# Patient Record
Sex: Female | Born: 2000
Health system: Southern US, Community
[De-identification: ages and names within clinical notes are randomized; demographics above are authoritative.]

## PROBLEM LIST (undated history)

## (undated) DIAGNOSIS — I880 Nonspecific mesenteric lymphadenitis: Secondary | ICD-10-CM

## (undated) DIAGNOSIS — IMO0002 Reserved for concepts with insufficient information to code with codable children: Secondary | ICD-10-CM

## (undated) HISTORY — PX: WISDOM TOOTH EXTRACTION: SHX21

## (undated) HISTORY — DX: Reserved for concepts with insufficient information to code with codable children: IMO0002

## (undated) HISTORY — DX: Nonspecific mesenteric lymphadenitis: I88.0

---
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
## 2019-09-30 DIAGNOSIS — H52223 Regular astigmatism, bilateral: Secondary | ICD-10-CM | POA: Diagnosis not present

## 2020-01-12 DIAGNOSIS — Z23 Encounter for immunization: Secondary | ICD-10-CM | POA: Diagnosis not present

## 2020-01-30 ENCOUNTER — Other Ambulatory Visit: Payer: Self-pay

## 2020-01-30 ENCOUNTER — Ambulatory Visit (INDEPENDENT_AMBULATORY_CARE_PROVIDER_SITE_OTHER): Payer: 59 | Admitting: Women's Health

## 2020-01-30 ENCOUNTER — Other Ambulatory Visit (HOSPITAL_COMMUNITY): Payer: Self-pay | Admitting: Women's Health

## 2020-01-30 ENCOUNTER — Encounter: Payer: Self-pay | Admitting: Women's Health

## 2020-01-30 VITALS — BP 124/80 | Ht 65.0 in | Wt 218.0 lb

## 2020-01-30 DIAGNOSIS — Z01419 Encounter for gynecological examination (general) (routine) without abnormal findings: Secondary | ICD-10-CM

## 2020-01-30 DIAGNOSIS — N926 Irregular menstruation, unspecified: Secondary | ICD-10-CM | POA: Insufficient documentation

## 2020-01-30 LAB — PREGNANCY, URINE: Preg Test, Ur: NEGATIVE

## 2020-01-30 MED ORDER — DROSPIRENONE-ETHINYL ESTRADIOL 3-0.02 MG PO TABS: 1.0000 | ORAL_TABLET | Freq: Every day | ORAL | 4 refills | Status: DC

## 2020-01-30 MED ORDER — MEDROXYPROGESTERONE ACETATE 10 MG PO TABS: 10.0000 mg | ORAL_TABLET | Freq: Every day | ORAL | 0 refills | Status: DC

## 2020-01-30 MED FILL — DROSPIR-ETH ESTRA 3/.02 MG: 3-0.02 | 84 days supply | Qty: 84 | Fill #0

## 2020-01-30 MED FILL — MEDROXYPROGESTERONE 10 MG T: 10 | 5 days supply | Qty: 5 | Fill #0

## 2020-01-30 NOTE — Progress Notes (Signed)
Lisa Watkins [birth year] [id number]    History:    Presents for annual exam.  New patient.  Reports cycles starting at age 19 but have been irregular since cycle started.  Cycles can be every 1 to 6 months reports usually has  3 -4 cycles per year.  No change in weight.  Thinks she has had Gardasil.  Denies spotting between cycles.  Virgin.  Denies any urinary symptoms, vaginal discharge or abdominal pain.  States feels hormonal/moody often.  Past medical history, past surgical history, family history and social history were all reviewed and documented in the EPIC chart.  Student [employer], contemplating radiology.  Father heart disease, mother healthy, hysterectomy for atypical glandular cells.  ROS:  A ROS was performed and pertinent positives and negatives are included.  Exam:  Vitals:   01/30/20 1404  BP: 124/80  Weight: 218 lb (98.9 kg)  Height: 5\' 5"  (1.651 m)   Body mass index is 36.28 kg/m.   General appearance:  Normal Thyroid:  Symmetrical, normal in size, without palpable masses or nodularity. Respiratory  Auscultation:  Clear without wheezing or rhonchi Cardiovascular  Auscultation:  Regular rate, without rubs, murmurs or gallops  Edema/varicosities:  Not grossly evident Abdominal  Soft,nontender, without masses, guarding or rebound.  Liver/spleen:  No organomegaly noted  Hernia:  None appreciated  Skin  Inspection:  Grossly normal   Breasts: Examined lying and sitting.     Right: Without masses, retractions, discharge or axillary adenopathy.     Left: Without masses, retractions, discharge or axillary adenopathy. Gentitourinary   Inguinal/mons:  Normal without inguinal adenopathy  External genitalia:  Normal  BUS/Urethra/Skene's glands:  Normal  Vagina:  Normal  Cervix: Not visualized   Uterus: Nontender, normal in size, shape and contour.  Midline and mobile  Adnexa/parametria:     Rt: Without masses or tenderness.   Lt: Without masses or tenderness.  Anus and  perineum: Normal    Assessment/Plan:  19 y.o. S WF virgin for annual exam with complaint of irregular cycles.  Cycles every 1 to 6 months  Plan: We will check CBC, TSH, prolactin.  UPT negative, withdrawal with Provera 10 mg p.o. daily for 5 days.  Options reviewed will try yes, prescription, proper use, slight risk for blood clots and strokes reviewed start up instructions discussed.  Importance of condoms if becomes sexually active.  Reviewed cycles may be light but should have a cycle with birth control pills.  Instructed to call if cycle or mood problems with birth control pills.  Reports think she will be able to remember daily.  SBEs, exercise, calcium rich foods, MVI daily encouraged.  Reviewed importance of increasing exercise and decreasing calories/carbs.  Pap screening guidelines reviewed.  Will check with her mother to make sure she has had Gardasil, and if not will return to office for series.     07-03-1984 [facility], 2:38 PM 01/30/2020

## 2020-01-30 NOTE — Patient Instructions (Addendum)
Take the provera for 5 days should have some bleeding or spotting, start the birth control (cycle control!) pills Check with your mom about Gardasil  Vaccine MVI daily  Health Maintenance, Female Adopting a healthy lifestyle and getting preventive care are important in promoting health and wellness. Ask your health care provider about:  The right schedule for you to have regular tests and exams.  Things you can do on your own to prevent diseases and keep yourself healthy. What should I know about diet, weight, and exercise? Eat a healthy diet   Eat a diet that includes plenty of vegetables, fruits, low-fat dairy products, and lean protein.  Do not eat a lot of foods that are high in solid fats, added sugars, or sodium. Maintain a healthy weight Body mass index (BMI) is used to identify weight problems. It estimates body fat based on height and weight. Your health care provider can help determine your BMI and help you achieve or maintain a healthy weight. Get regular exercise Get regular exercise. This is one of the most important things you can do for your health. Most adults should:  Exercise for at least 150 minutes each week. The exercise should increase your heart rate and make you sweat (moderate-intensity exercise).  Do strengthening exercises at least twice a week. This is in addition to the moderate-intensity exercise.  Spend less time sitting. Even light physical activity can be beneficial. Watch cholesterol and blood lipids Have your blood tested for lipids and cholesterol at 20 years of age, then have this test every 5 years. Have your cholesterol levels checked more often if:  Your lipid or cholesterol levels are high.  You are older than 20 years of age.  You are at high risk for heart disease. What should I know about cancer screening? Depending on your health history and family history, you may need to have cancer screening at various ages. This may include  screening for:  Breast cancer.  Cervical cancer.  Colorectal cancer.  Skin cancer.  Lung cancer. What should I know about heart disease, diabetes, and high blood pressure? Blood pressure and heart disease  High blood pressure causes heart disease and increases the risk of stroke. This is more likely to develop in people who have high blood pressure readings, are of African descent, or are overweight.  Have your blood pressure checked: ? Every 3-5 years if you are 33-55 years of age. ? Every year if you are 79 years old or older. Diabetes Have regular diabetes screenings. This checks your fasting blood sugar level. Have the screening done:  Once every three years after age 46 if you are at a normal weight and have a low risk for diabetes.  More often and at a younger age if you are overweight or have a high risk for diabetes. What should I know about preventing infection? Hepatitis B If you have a higher risk for hepatitis B, you should be screened for this virus. Talk with your health care provider to find out if you are at risk for hepatitis B infection. Hepatitis C Testing is recommended for:  Everyone born from [birth year] through 1966.  Anyone with known risk factors for hepatitis C. Sexually transmitted infections (STIs)  Get screened for STIs, including gonorrhea and chlamydia, if: ? You are sexually active and are younger than 20 years of age. ? You are older than 20 years of age and your health care provider tells you that you are at risk for this  type of infection. ? Your sexual activity has changed since you were last screened, and you are at increased risk for chlamydia or gonorrhea. Ask your health care provider if you are at risk.  Ask your health care provider about whether you are at high risk for HIV. Your health care provider may recommend a prescription medicine to help prevent HIV infection. If you choose to take medicine to prevent HIV, you should first get  tested for HIV. You should then be tested every 3 months for as long as you are taking the medicine. Pregnancy  If you are about to stop having your period (premenopausal) and you may become pregnant, seek counseling before you get pregnant.  Take 400 to 800 micrograms (mcg) of folic acid every day if you become pregnant.  Ask for birth control (contraception) if you want to prevent pregnancy. Osteoporosis and menopause Osteoporosis is a disease in which the bones lose minerals and strength with aging. This can result in bone fractures. If you are 48 years old or older, or if you are at risk for osteoporosis and fractures, ask your health care provider if you should:  Be screened for bone loss.  Take a calcium or vitamin D supplement to lower your risk of fractures.  Be given hormone replacement therapy (HRT) to treat symptoms of menopause. Follow these instructions at home: Lifestyle  Do not use any products that contain nicotine or tobacco, such as cigarettes, e-cigarettes, and chewing tobacco. If you need help quitting, ask your health care provider.  Do not use street drugs.  Do not share needles.  Ask your health care provider for help if you need support or information about quitting drugs. Alcohol use  Do not drink alcohol if: ? Your health care provider tells you not to drink. ? You are pregnant, may be pregnant, or are planning to become pregnant.  If you drink alcohol: ? Limit how much you use to 0-1 drink a day. ? Limit intake if you are breastfeeding.  Be aware of how much alcohol is in your drink. In the [place] one drink equals one 12 oz bottle of beer (355 mL), one 5 oz glass of wine (148 mL), or one 1 oz glass of hard liquor (44 mL). General instructions  Schedule regular health, dental, and eye exams.  Stay current with your vaccines.  Tell your health care provider if: ? You often feel depressed. ? You have ever been abused or do not feel safe at  home. Summary  Adopting a healthy lifestyle and getting preventive care are important in promoting health and wellness.  Follow your health care provider's instructions about healthy diet, exercising, and getting tested or screened for diseases.  Follow your health care provider's instructions on monitoring your cholesterol and blood pressure. This information is not intended to replace advice given to you by your health care provider. Make sure you discuss any questions you have with your health care provider. Document Revised: 09/22/2018 Document Reviewed: 09/22/2018 Elsevier Patient Education  2020 [employer].

## 2020-01-31 LAB — CBC WITH DIFFERENTIAL/PLATELET
Absolute Monocytes: 456 cells/uL (ref 200–950)
Basophils Absolute: 80 cells/uL (ref 0–200)
Basophils Relative: 1.4 %
Eosinophils Absolute: 108 cells/uL (ref 15–500)
Eosinophils Relative: 1.9 %
HCT: 45.4 % — ABNORMAL HIGH (ref 35.0–45.0)
Hemoglobin: 15.2 g/dL (ref 11.7–15.5)
Lymphs Abs: 1391 cells/uL (ref 850–3900)
MCH: 31.6 pg (ref 27.0–33.0)
MCHC: 33.5 g/dL (ref 32.0–36.0)
MCV: 94.4 fL (ref 80.0–100.0)
MPV: 10.8 fL (ref 7.5–12.5)
Monocytes Relative: 8 %
Neutro Abs: 3665 cells/uL (ref 1500–7800)
Neutrophils Relative %: 64.3 %
Platelets: 338 10*3/uL (ref 140–400)
RBC: 4.81 10*6/uL (ref 3.80–5.10)
RDW: 11.9 % (ref 11.0–15.0)
Total Lymphocyte: 24.4 %
WBC: 5.7 10*3/uL (ref 3.8–10.8)

## 2020-01-31 LAB — PROLACTIN: Prolactin: 8.6 ng/mL

## 2020-01-31 LAB — TSH: TSH: 3.57 mIU/L

## 2020-02-01 LAB — URINE CULTURE
MICRO NUMBER:: 10383504
Result:: NO GROWTH
SPECIMEN QUALITY:: ADEQUATE

## 2020-02-01 LAB — URINALYSIS, COMPLETE W/RFL CULTURE
Bilirubin Urine: NEGATIVE
Glucose, UA: NEGATIVE
Hgb urine dipstick: NEGATIVE
Hyaline Cast: NONE SEEN /LPF
Nitrites, Initial: NEGATIVE
Specific Gravity, Urine: 1.022 (ref 1.001–1.03)
pH: 5.5 (ref 5.0–8.0)

## 2020-02-01 LAB — CULTURE INDICATED

## 2020-02-03 DIAGNOSIS — Z23 Encounter for immunization: Secondary | ICD-10-CM | POA: Diagnosis not present

## 2020-02-18 DIAGNOSIS — Z20822 Contact with and (suspected) exposure to covid-19: Secondary | ICD-10-CM | POA: Diagnosis not present

## 2020-02-25 ENCOUNTER — Other Ambulatory Visit: Payer: Self-pay

## 2020-02-25 ENCOUNTER — Ambulatory Visit
Admission: EM | Admit: 2020-02-25 | Discharge: 2020-02-25 | Disposition: A | Discharge status: Home / Self Care | Payer: 59 | Attending: Emergency Medicine | Admitting: Emergency Medicine

## 2020-02-25 DIAGNOSIS — H66001 Acute suppurative otitis media without spontaneous rupture of ear drum, right ear: Secondary | ICD-10-CM

## 2020-02-25 DIAGNOSIS — H9201 Otalgia, right ear: Secondary | ICD-10-CM

## 2020-02-25 MED ORDER — AMOXICILLIN 500 MG PO CAPS: 500.0000 mg | ORAL_CAPSULE | Freq: Two times a day (BID) | ORAL | 0 refills | Status: AC

## 2020-02-25 NOTE — ED Triage Notes (Signed)
Pt presents with right ear pain that began yesterday  

## 2020-02-25 NOTE — Discharge Instructions (Signed)
Rest and drink plenty of fluids Prescribed amoxicillin 500 mg twice daily for 10 days Continue to use OTC ibuprofen and/ or tylenol as needed for pain control Follow up with PCP if symptoms persists Return here or go to the ER if you have any new or worsening symptoms fever, chills, nausea, vomiting, chest pain, shortness of breath, worsening ear pain, redness and swelling behind ear, etc..Marland Kitchen

## 2020-02-25 NOTE — ED Provider Notes (Signed)
[facility]   [id number] 02/25/20 Arrival Time: 1409  CC: EAR PAIN  SUBJECTIVE: History from: patient.  Lisa Watkins is a 19 y.o. female who presents with of RT ear pain x 1 day.  Denies a precipitating event, such as swimming or wearing ear plugs.  Patient states the pain is constant and achy in character.  Patient has tried OTC advil without relief.  Denies aggravating factors.  Reports similar symptoms in the past with ear infection.  Denies fever, chills, fatigue, sinus pain, rhinorrhea, ear discharge, sore throat, SOB, wheezing, chest pain, nausea, changes in bowel or bladder habits.    ROS: As per HPI.  All other pertinent ROS negative.     Past Medical History:  Diagnosis Date  . Acute mesenteric adenitis 7 2011   abd ct and ugi with sbft   . IUGR (intrauterine growth restriction)    39 weeks gest diabetes   Past Surgical History:  Procedure Laterality Date  . WISDOM TOOTH EXTRACTION     No Known Allergies No current facility-administered medications on file prior to encounter.   Current Outpatient Medications on File Prior to Encounter  Medication Sig Dispense Refill  . drospirenone-ethinyl estradiol (YAZ) 3-0.02 MG tablet Take 1 tablet by mouth daily. 3 Package 4  . medroxyPROGESTERone (PROVERA) 10 MG tablet Take 1 tablet (10 mg total) by mouth daily. 5 tablet 0  . trimethoprim-polymyxin b (POLYTRIM) ophthalmic solution Place 1 drop into the right eye every 4 (four) hours. While awake for 1 week. 10 mL 0   Social History   Socioeconomic History  . Marital status: Single    Spouse name: Not on file  . Number of children: Not on file  . Years of education: Not on file  . Highest education level: Not on file  Occupational History  . Not on file  Tobacco Use  . Smoking status: Passive Smoke Exposure - Never Smoker  . Smokeless tobacco: Never Used  Substance and Sexual Activity  . Alcohol use: No  . Drug use: No  . Sexual activity: Never    Comment:  virgin  Other Topics Concern  . Not on file  Social History Narrative   HH of 3   Both parents smoke   Mom is a Engineer, civil (consulting)   Pets 3 dogs   To enter 6th grade at [employer]   Social Determinants of Health   Financial Resource Strain:   . Difficulty of Paying Living Expenses:   Food Insecurity:   . Worried About Programme researcher, broadcasting/film/video in the Last Year:   . Barista in the Last Year:   Transportation Needs:   . Freight forwarder (Medical):   Marland Kitchen Lack of Transportation (Non-Medical):   Physical Activity:   . Days of Exercise per Week:   . Minutes of Exercise per Session:   Stress:   . Feeling of Stress :   Social Connections:   . Frequency of Communication with Friends and Family:   . Frequency of Social Gatherings with Friends and Family:   . Attends Religious Services:   . Active Member of Clubs or Organizations:   . Attends Banker Meetings:   Marland Kitchen Marital Status:   Intimate Partner Violence:   . Fear of Current or Ex-Partner:   . Emotionally Abused:   Marland Kitchen Physically Abused:   . Sexually Abused:    Family History  Problem Relation Age of Onset  . Heart attack Father   .  Hyperlipidemia Father   . Hyperlipidemia Maternal Grandfather   . Hypertension Maternal Grandfather   . Cancer Paternal Grandmother        skin  . Peripheral vascular disease Paternal Grandfather     OBJECTIVE:  Vitals:   02/25/20 1429  BP: 136/87  Pulse: (!) 109  Resp: 18  Temp: 98 F (36.7 C)  TempSrc: Oral  SpO2: 97%     General appearance: alert; mildly fatigued appearing, nontoxic; speaking in full sentences and tolerating own secretions HEENT: NCAT; Ears: EACs clear, LT TM pearly gray, RT TM erythematous with purulent drainage; Eyes: PERRL.  EOM grossly intact.Nose: nares patent without rhinorrhea, Throat: oropharynx clear, tonsils non erythematous or enlarged, uvula midline  Neck: supple without LAD Lungs: unlabored respirations, symmetrical air entry;  cough: absent; no respiratory distress; CTAB Heart: regular rate and rhythm.  Skin: warm and dry Psychological: alert and cooperative; normal mood and affect  ASSESSMENT & PLAN:  1. Right ear pain   2. Non-recurrent acute suppurative otitis media of right ear without spontaneous rupture of tympanic membrane     Meds ordered this encounter  Medications  . amoxicillin (AMOXIL) 500 MG capsule    Sig: Take 1 capsule (500 mg total) by mouth 2 (two) times daily for 10 days.    Dispense:  20 capsule    Refill:  0    Order Specific Question:   Supervising Provider    Answer:   Raylene Everts [[id number]]   Rest and drink plenty of fluids Prescribed amoxicillin 500 mg twice daily for 10 days Continue to use OTC ibuprofen and/ or tylenol as needed for pain control Follow up with PCP if symptoms persists Return here or go to the ER if you have any new or worsening symptoms fever, chills, nausea, vomiting, chest pain, shortness of breath, worsening ear pain, redness and swelling behind ear, etc...  Reviewed expectations re: course of current medical issues. Questions answered. Outlined signs and symptoms indicating need for more acute intervention. Patient verbalized understanding. After Visit Summary given.         Lestine Box, PA-C 02/25/20 1515

## 2020-04-11 MED FILL — DROSPIR-ETH ESTRA 3/.02 MG: 3-0.02 | 84 days supply | Qty: 84 | Fill #1

## 2020-06-26 MED FILL — DROSPIRENONE-ETHINYL ESTRAD: 3-0.02 | 84 days supply | Qty: 84 | Fill #2

## 2020-10-02 MED FILL — DROSPIRENONE-ETHINYL ESTRAD: 3-0.02 | 84 days supply | Qty: 84 | Fill #3

## 2020-12-13 MED FILL — DROSPIRENONE-EE 3-0.02 MG T: 3-0.02 | 84 days supply | Qty: 84 | Fill #4

## 2021-01-01 ENCOUNTER — Ambulatory Visit
Admission: EM | Admit: 2021-01-01 | Discharge: 2021-01-01 | Disposition: A | Discharge status: Home / Self Care | Payer: 59 | Attending: Physician Assistant | Admitting: Physician Assistant

## 2021-01-01 DIAGNOSIS — L03032 Cellulitis of left toe: Secondary | ICD-10-CM | POA: Diagnosis not present

## 2021-01-01 MED ORDER — SULFAMETHOXAZOLE-TRIMETHOPRIM 800-160 MG PO TABS: 1.0000 | ORAL_TABLET | Freq: Two times a day (BID) | ORAL | 0 refills | Status: DC

## 2021-01-01 NOTE — ED Triage Notes (Signed)
Pt presents with c/o left toe pain from possible ingrown toenail

## 2021-01-01 NOTE — Discharge Instructions (Signed)
Soak area 20 minutes 4 times a day °

## 2021-01-03 NOTE — ED Provider Notes (Signed)
[facility]    CSN: [id number] Arrival date & time: 01/01/21  1457      History   Chief Complaint Chief Complaint  Patient presents with  . Nail Problem    HPI Lisa Watkins is a 20 y.o. female.   Pt complains of swelling around left 1st toe after getting a pedicure      Past Medical History:  Diagnosis Date  . Acute mesenteric adenitis 7 2011   abd ct and ugi with sbft   . IUGR (intrauterine growth restriction)    39 weeks gest diabetes    Patient Active Problem List   Diagnosis Date Noted  . Irregular periods/menstrual cycles 01/30/2020  . Sports physical 11/10/2013  . Well adolescent visit 04/18/2011  . BMI (body mass index), pediatric, > 99% for age 64/03/2011  . RHINITIS 11/07/2008    Past Surgical History:  Procedure Laterality Date  . WISDOM TOOTH EXTRACTION      OB History    Gravida  0   Para  0   Term  0   Preterm  0   AB  0   Living  0     SAB  0   IAB  0   Ectopic  0   Multiple  0   Live Births  0            Home Medications    Prior to Admission medications   Medication Sig Start Date End Date Taking? Authorizing Provider  sulfamethoxazole-trimethoprim (BACTRIM DS) 800-160 MG tablet Take 1 tablet by mouth 2 (two) times daily. 01/01/21  Yes Elson Areas, PA-C  drospirenone-ethinyl estradiol (YAZ) 3-0.02 MG tablet Take 1 tablet by mouth daily. 01/30/20   Harrington Challenger, NP  medroxyPROGESTERone (PROVERA) 10 MG tablet Take 1 tablet (10 mg total) by mouth daily. 01/30/20   Harrington Challenger, NP  trimethoprim-polymyxin b (POLYTRIM) ophthalmic solution Place 1 drop into the right eye every 4 (four) hours. While awake for 1 week. 10/14/16   Shade Flood, MD    Family History Family History  Problem Relation Age of Onset  . Heart attack Father   . Hyperlipidemia Father   . Hyperlipidemia Maternal Grandfather   . Hypertension Maternal Grandfather   . Cancer Paternal Grandmother        skin  . Peripheral  vascular disease Paternal Grandfather     Social History Social History   Tobacco Use  . Smoking status: Passive Smoke Exposure - Never Smoker  . Smokeless tobacco: Never Used  Substance Use Topics  . Alcohol use: No  . Drug use: No     Allergies   Patient has no known allergies.   Review of Systems Review of Systems  Skin: Positive for color change. Negative for wound.  All other systems reviewed and are negative.    Physical Exam Triage Vital Signs ED Triage Vitals  Enc Vitals Group     BP 01/01/21 1539 133/87     Pulse Rate 01/01/21 1539 92     Resp 01/01/21 1539 20     Temp 01/01/21 1539 98.6 F (37 C)     Temp src --      SpO2 01/01/21 1539 98 %     Weight --      Height --      Head Circumference --      Peak Flow --      Pain Score 01/01/21 1538 4     Pain  Loc --      Pain Edu? --      Excl. in GC? --    No data found.  Updated Vital Signs BP 133/87   Pulse 92   Temp 98.6 F (37 C)   Resp 20   LMP 12/04/2020   SpO2 98%   Visual Acuity Right Eye Distance:   Left Eye Distance:   Bilateral Distance:    Right Eye Near:   Left Eye Near:    Bilateral Near:     Physical Exam Vitals reviewed.  Constitutional:      Appearance: Normal appearance.  Musculoskeletal:        General: Swelling present.     Comments: Swollen area cuticle, from nv and ns intact  Skin:    General: Skin is warm.  Neurological:     General: No focal deficit present.     Mental Status: She is alert.  Psychiatric:        Mood and Affect: Mood normal.      UC Treatments / Results  Labs (all labs ordered are listed, but only abnormal results are displayed) Labs Reviewed - No data to display  EKG   Radiology No results found.  Procedures Procedures (including critical care time)  Medications Ordered in UC Medications - No data to display  Initial Impression / Assessment and Plan / UC Course  I have reviewed the triage vital signs and the nursing  notes.  Pertinent labs & imaging results that were available during my care of the patient were reviewed by me and considered in my medical decision making (see chart for details).     MDM:  I attempted drainage with needle.  No drainage.  Pt given rx for doxycycline Final Clinical Impressions(s) / UC Diagnoses   Final diagnoses:  Paronychia of great toe, left     Discharge Instructions     Soak area 20 minutes 4 times a day   ED Prescriptions    Medication Sig Dispense Auth. Provider   sulfamethoxazole-trimethoprim (BACTRIM DS) 800-160 MG tablet Take 1 tablet by mouth 2 (two) times daily. 20 tablet Elson Areas, [place]     PDMP not reviewed this encounter.   An After Visit Summary was printed and given to the patient.   Elson Areas, [place] 01/03/21 1336

## 2021-01-30 ENCOUNTER — Ambulatory Visit (INDEPENDENT_AMBULATORY_CARE_PROVIDER_SITE_OTHER): Payer: 59 | Admitting: Nurse Practitioner

## 2021-01-30 ENCOUNTER — Other Ambulatory Visit (HOSPITAL_COMMUNITY): Payer: Self-pay

## 2021-01-30 ENCOUNTER — Other Ambulatory Visit: Payer: Self-pay

## 2021-01-30 ENCOUNTER — Encounter: Payer: Self-pay | Admitting: Nurse Practitioner

## 2021-01-30 VITALS — BP 124/84 | Ht 66.0 in | Wt 213.0 lb

## 2021-01-30 DIAGNOSIS — Z01419 Encounter for gynecological examination (general) (routine) without abnormal findings: Secondary | ICD-10-CM

## 2021-01-30 DIAGNOSIS — N926 Irregular menstruation, unspecified: Secondary | ICD-10-CM

## 2021-01-30 MED ORDER — DROSPIRENONE-ETHINYL ESTRADIOL 3-0.02 MG PO TABS
1.0000 | ORAL_TABLET | Freq: Every day | ORAL | 3 refills | Status: DC
  Filled 2021-01-30 – 2021-03-13 (×2): qty 84, 84d supply, fill #0
  Filled 2021-05-29: qty 84, 84d supply, fill #1
  Filled 2021-08-29: qty 84, 84d supply, fill #2

## 2021-01-30 NOTE — Progress Notes (Signed)
   Lisa Watkins [birth year] [id number]   History:  20 y.o. G0 presents for annual exam without GYN complaints. OCPs for cycle regulation. Virgin. Unsure if she had Gardasil series.   Gynecologic History Patient's last menstrual period was 01/03/2021. Period Cycle (Days): 28 Period Duration (Days): 6 Period Pattern: Regular Menstrual Flow: Moderate,Heavy Dysmenorrhea: (!) Moderate Dysmenorrhea Symptoms: Cramping Contraception/Family planning: abstinence  Health Maintenance Last Pap: N/A Last mammogram: N/A Last colonoscopy: N/A Last Dexa: N/A  Past medical history, past surgical history, family history and social history were all reviewed and documented in the EPIC chart. 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   ROS:  A ROS was performed and pertinent positives and negatives are included.  Exam:  Vitals:   01/30/21 1054  BP: 124/84  Weight: 213 lb (96.6 kg)  Height: 5\' 6"  (1.676 m)   Body mass index is 34.38 kg/m.  General appearance:  Normal Thyroid:  Symmetrical, normal in size, without palpable masses or nodularity. Respiratory  Auscultation:  Clear without wheezing or rhonchi Cardiovascular  Auscultation:  Regular rate, without rubs, murmurs or gallops  Edema/varicosities:  Not grossly evident Abdominal  Soft,nontender, without masses, guarding or rebound.  Liver/spleen:  No organomegaly noted  Hernia:  None appreciated  Skin  Inspection:  Grossly normal Breasts: Examined lying and sitting.   Right: Without masses, retractions, nipple discharge or axillary adenopathy.   Left: Without masses, retractions, nipple discharge or axillary adenopathy. Gentitourinary   Inguinal/mons:  Normal without inguinal adenopathy  External genitalia:  Normal appearing vulva with no masses, tenderness, or lesions  BUS/Urethra/Skene's glands:  Normal  Vagina:  Normal appearing with normal color and discharge, no lesions  Cervix:  Normal appearing without discharge or lesions  Uterus:   Normal in size, shape and contour.  Midline and mobile, nontender  Adnexa/parametria:     Rt: Normal in size, without masses or tenderness.   Lt: Normal in size, without masses or tenderness.  Anus and perineum: Normal  Assessment/Plan:  20 y.o. G0 for annual exam.   Well female exam with routine gynecological exam - Education provided on SBEs, importance of preventative screenings, current guidelines, high calcium diet, regular exercise, and multivitamin daily.  Irregular menses - Plan: drospirenone-ethinyl estradiol (YAZ) 3-0.02 MG tablet daily. Taking as prescribed and having regular monthly cycles. Refill x 1 year provided.   Return in 1 year for annual.   10-30-1996 DNP, 11:04 AM 01/30/2021

## 2021-01-30 NOTE — Patient Instructions (Signed)
Health Maintenance, Female Adopting a healthy lifestyle and getting preventive care are important in promoting health and wellness. Ask your health care provider about:  The right schedule for you to have regular tests and exams.  Things you can do on your own to prevent diseases and keep yourself healthy. What should I know about diet, weight, and exercise? Eat a healthy diet  Eat a diet that includes plenty of vegetables, fruits, low-fat dairy products, and lean protein.  Do not eat a lot of foods that are high in solid fats, added sugars, or sodium.   Maintain a healthy weight Body mass index (BMI) is used to identify weight problems. It estimates body fat based on height and weight. Your health care provider can help determine your BMI and help you achieve or maintain a healthy weight. Get regular exercise Get regular exercise. This is one of the most important things you can do for your health. Most adults should:  Exercise for at least 150 minutes each week. The exercise should increase your heart rate and make you sweat (moderate-intensity exercise).  Do strengthening exercises at least twice a week. This is in addition to the moderate-intensity exercise.  Spend less time sitting. Even light physical activity can be beneficial. Watch cholesterol and blood lipids Have your blood tested for lipids and cholesterol at 20 years of age, then have this test every 5 years. Have your cholesterol levels checked more often if:  Your lipid or cholesterol levels are high.  You are older than 21 years of age.  You are at high risk for heart disease. What should I know about cancer screening? Depending on your health history and family history, you may need to have cancer screening at various ages. This may include screening for:  Breast cancer.  Cervical cancer.  Colorectal cancer.  Skin cancer.  Lung cancer. What should I know about heart disease, diabetes, and high blood  pressure? Blood pressure and heart disease  High blood pressure causes heart disease and increases the risk of stroke. This is more likely to develop in people who have high blood pressure readings, are of African descent, or are overweight.  Have your blood pressure checked: ? Every 3-5 years if you are 18-39 years of age. ? Every year if you are 40 years old or older. Diabetes Have regular diabetes screenings. This checks your fasting blood sugar level. Have the screening done:  Once every three years after age 40 if you are at a normal weight and have a low risk for diabetes.  More often and at a younger age if you are overweight or have a high risk for diabetes. What should I know about preventing infection? Hepatitis B If you have a higher risk for hepatitis B, you should be screened for this virus. Talk with your health care provider to find out if you are at risk for hepatitis B infection. Hepatitis C Testing is recommended for:  Everyone born from 1945 through 1965.  Anyone with known risk factors for hepatitis C. Sexually transmitted infections (STIs)  Get screened for STIs, including gonorrhea and chlamydia, if: ? You are sexually active and are younger than 21 years of age. ? You are older than 21 years of age and your health care provider tells you that you are at risk for this type of infection. ? Your sexual activity has changed since you were last screened, and you are at increased risk for chlamydia or gonorrhea. Ask your health care provider   if you are at risk.  Ask your health care provider about whether you are at high risk for HIV. Your health care provider may recommend a prescription medicine to help prevent HIV infection. If you choose to take medicine to prevent HIV, you should first get tested for HIV. You should then be tested every 3 months for as long as you are taking the medicine. Pregnancy  If you are about to stop having your period (premenopausal) and  you may become pregnant, seek counseling before you get pregnant.  Take 400 to 800 micrograms (mcg) of folic acid every day if you become pregnant.  Ask for birth control (contraception) if you want to prevent pregnancy. Osteoporosis and menopause Osteoporosis is a disease in which the bones lose minerals and strength with aging. This can result in bone fractures. If you are 65 years old or older, or if you are at risk for osteoporosis and fractures, ask your health care provider if you should:  Be screened for bone loss.  Take a calcium or vitamin D supplement to lower your risk of fractures.  Be given hormone replacement therapy (HRT) to treat symptoms of menopause. Follow these instructions at home: Lifestyle  Do not use any products that contain nicotine or tobacco, such as cigarettes, e-cigarettes, and chewing tobacco. If you need help quitting, ask your health care provider.  Do not use street drugs.  Do not share needles.  Ask your health care provider for help if you need support or information about quitting drugs. Alcohol use  Do not drink alcohol if: ? Your health care provider tells you not to drink. ? You are pregnant, may be pregnant, or are planning to become pregnant.  If you drink alcohol: ? Limit how much you use to 0-1 drink a day. ? Limit intake if you are breastfeeding.  Be aware of how much alcohol is in your drink. In the [place] one drink equals one 12 oz bottle of beer (355 mL), one 5 oz glass of wine (148 mL), or one 1 oz glass of hard liquor (44 mL). General instructions  Schedule regular health, dental, and eye exams.  Stay current with your vaccines.  Tell your health care provider if: ? You often feel depressed. ? You have ever been abused or do not feel safe at home. Summary  Adopting a healthy lifestyle and getting preventive care are important in promoting health and wellness.  Follow your health care provider's instructions about healthy  diet, exercising, and getting tested or screened for diseases.  Follow your health care provider's instructions on monitoring your cholesterol and blood pressure. This information is not intended to replace advice given to you by your health care provider. Make sure you discuss any questions you have with your health care provider. Document Revised: 09/22/2018 Document Reviewed: 09/22/2018 Elsevier Patient Education  2021 Elsevier Inc.  

## 2021-03-13 ENCOUNTER — Other Ambulatory Visit (HOSPITAL_COMMUNITY): Payer: Self-pay

## 2021-05-29 ENCOUNTER — Other Ambulatory Visit (HOSPITAL_COMMUNITY): Payer: Self-pay

## 2021-05-30 ENCOUNTER — Other Ambulatory Visit (HOSPITAL_COMMUNITY): Payer: Self-pay

## 2021-08-29 ENCOUNTER — Other Ambulatory Visit (HOSPITAL_COMMUNITY): Payer: Self-pay

## 2021-10-25 ENCOUNTER — Encounter: Payer: Self-pay | Admitting: Nurse Practitioner

## 2021-10-25 NOTE — Telephone Encounter (Signed)
Lisa Watkins wasn't sure if you wanted to schedule ultrasound.

## 2021-10-28 NOTE — Telephone Encounter (Signed)
I recommend she come in to discuss further as PCOS can be a complex diagnosis and we may need to consider other management options.

## 2021-11-04 ENCOUNTER — Ambulatory Visit: Payer: 59 | Admitting: Nurse Practitioner

## 2021-11-04 ENCOUNTER — Encounter: Payer: Self-pay | Admitting: Nurse Practitioner

## 2021-11-04 ENCOUNTER — Other Ambulatory Visit: Payer: Self-pay

## 2021-11-04 VITALS — BP 130/80

## 2021-11-04 DIAGNOSIS — N926 Irregular menstruation, unspecified: Secondary | ICD-10-CM

## 2021-11-04 DIAGNOSIS — N939 Abnormal uterine and vaginal bleeding, unspecified: Secondary | ICD-10-CM | POA: Diagnosis not present

## 2021-11-04 NOTE — Progress Notes (Signed)
° °  Acute Office Visit  Subjective:    Patient ID: Lisa Watkins, female    DOB: 02-Apr-2001, 21 y.o.   MRN: [id number]   HPI 21 y.o. presents today for vaginal bleeding x 4 weeks. Bleeding began 10/06/2021 and has occurred daily since. Bleeding ranges from light to moderate and is accompanied by cramping. Was started on OCPs for cycle regulation due to irregular cycles since menarche at age 59. This past year cycles have been ranging from 4-6 weeks. She denies acne or female-pattern hair growth. Has never been sexually active.   Review of Systems  Constitutional: Negative.   Gastrointestinal:  Positive for abdominal pain (Cramping).  Endocrine: Negative for cold intolerance and heat intolerance.  Genitourinary:  Positive for menstrual problem.      Objective:    Physical Exam Constitutional:      Appearance: Normal appearance.  Abdominal:     Tenderness: There is no abdominal tenderness.  Genitourinary:    General: Normal vulva.     Vagina: Normal.     Cervix: Normal.     Uterus: Normal.     BP 130/80    LMP 10/06/2021  Wt Readings from Last 3 Encounters:  01/30/21 213 lb (96.6 kg)  01/30/20 218 lb (98.9 kg) (99 %, Z= 2.19)*  07/17/17 210 lb (95.3 kg) (98 %, Z= 2.12)*   * Growth percentiles are based on CDC (Girls, 2-20 Years) data.        Assessment & Plan:   Problem List Items Addressed This Visit   None Visit Diagnoses     Abnormal uterine bleeding    -  Primary   Relevant Orders   Follicle stimulating hormone   Luteinizing hormone   Estradiol   Testos,Total,Free and SHBG (Female)   17-Hydroxyprogesterone   TSH   CBC   Irregular menses       Relevant Orders   Follicle stimulating hormone   Luteinizing hormone   Estradiol   TSH      Plan: Labs today to include those listed above. Briefly discussed PCOS diagnosis and management options. Will consider pelvic ultrasound if blood work is normal. She is agreeable to plan.      [name] DNP, 3:49  PM 11/04/2021

## 2021-11-09 LAB — CBC
HCT: 41.8 % (ref 35.0–45.0)
Hemoglobin: 14.2 g/dL (ref 11.7–15.5)
MCH: 31 pg (ref 27.0–33.0)
MCHC: 34 g/dL (ref 32.0–36.0)
MCV: 91.3 fL (ref 80.0–100.0)
MPV: 10.5 fL (ref 7.5–12.5)
Platelets: 366 10*3/uL (ref 140–400)
RBC: 4.58 10*6/uL (ref 3.80–5.10)
RDW: 11.8 % (ref 11.0–15.0)
WBC: 7.5 10*3/uL (ref 3.8–10.8)

## 2021-11-09 LAB — TESTOS,TOTAL,FREE AND SHBG (FEMALE)
Free Testosterone: 4.3 pg/mL (ref 0.1–6.4)
Sex Hormone Binding: 81 nmol/L (ref 17–124)
Testosterone, Total, LC-MS-MS: 72 ng/dL — ABNORMAL HIGH (ref 2–45)

## 2021-11-09 LAB — LUTEINIZING HORMONE: LH: 12.1 m[IU]/mL

## 2021-11-09 LAB — ESTRADIOL: Estradiol: 45 pg/mL

## 2021-11-09 LAB — 17-HYDROXYPROGESTERONE: 17-OH-Progesterone, LC/MS/MS: 47 ng/dL

## 2021-11-09 LAB — FOLLICLE STIMULATING HORMONE: FSH: 6.3 m[IU]/mL

## 2021-11-09 LAB — TSH: TSH: 4.43 mIU/L

## 2021-11-11 ENCOUNTER — Encounter: Payer: Self-pay | Admitting: Nurse Practitioner

## 2021-11-12 ENCOUNTER — Other Ambulatory Visit: Payer: Self-pay | Admitting: Nurse Practitioner

## 2021-11-12 ENCOUNTER — Other Ambulatory Visit (HOSPITAL_COMMUNITY): Payer: Self-pay

## 2021-11-12 DIAGNOSIS — E282 Polycystic ovarian syndrome: Secondary | ICD-10-CM

## 2021-11-12 DIAGNOSIS — N921 Excessive and frequent menstruation with irregular cycle: Secondary | ICD-10-CM

## 2021-11-12 MED ORDER — DROSPIRENONE-ETHINYL ESTRADIOL 3-0.03 MG PO TABS
1.0000 | ORAL_TABLET | Freq: Every day | ORAL | 0 refills | Status: DC
  Filled 2021-11-12: qty 84, 84d supply, fill #0

## 2021-12-31 ENCOUNTER — Encounter: Payer: Self-pay | Admitting: Nurse Practitioner

## 2022-01-01 ENCOUNTER — Other Ambulatory Visit: Payer: Self-pay | Admitting: Nurse Practitioner

## 2022-01-01 ENCOUNTER — Other Ambulatory Visit (HOSPITAL_COMMUNITY): Payer: Self-pay

## 2022-01-01 DIAGNOSIS — E282 Polycystic ovarian syndrome: Secondary | ICD-10-CM

## 2022-01-01 DIAGNOSIS — N921 Excessive and frequent menstruation with irregular cycle: Secondary | ICD-10-CM

## 2022-01-01 MED ORDER — DROSPIRENONE-ETHINYL ESTRADIOL 3-0.03 MG PO TABS
1.0000 | ORAL_TABLET | Freq: Every day | ORAL | 0 refills | Status: DC
  Filled 2022-01-01 – 2022-01-23 (×3): qty 56, 56d supply, fill #0

## 2022-01-01 NOTE — Telephone Encounter (Signed)
Patient scheduled on 02/03/22 ?

## 2022-01-01 NOTE — Telephone Encounter (Signed)
I provided her with a refill. She is due for an annual exam in April. Please have her schedule. ?

## 2022-01-02 ENCOUNTER — Other Ambulatory Visit (HOSPITAL_COMMUNITY): Payer: Self-pay

## 2022-01-21 ENCOUNTER — Ambulatory Visit
Admission: EM | Admit: 2022-01-21 | Discharge: 2022-01-21 | Disposition: A | Discharge status: Home / Self Care | Payer: 59 | Attending: Urgent Care | Admitting: Urgent Care

## 2022-01-21 ENCOUNTER — Encounter: Payer: Self-pay | Admitting: Emergency Medicine

## 2022-01-21 DIAGNOSIS — H9201 Otalgia, right ear: Secondary | ICD-10-CM

## 2022-01-21 DIAGNOSIS — R0989 Other specified symptoms and signs involving the circulatory and respiratory systems: Secondary | ICD-10-CM

## 2022-01-21 DIAGNOSIS — Z20818 Contact with and (suspected) exposure to other bacterial communicable diseases: Secondary | ICD-10-CM

## 2022-01-21 DIAGNOSIS — R070 Pain in throat: Secondary | ICD-10-CM

## 2022-01-21 DIAGNOSIS — J020 Streptococcal pharyngitis: Secondary | ICD-10-CM

## 2022-01-21 DIAGNOSIS — R052 Subacute cough: Secondary | ICD-10-CM | POA: Diagnosis not present

## 2022-01-21 LAB — POCT RAPID STREP A (OFFICE): Rapid Strep A Screen: POSITIVE — AB

## 2022-01-21 MED ORDER — AMOXICILLIN 500 MG PO CAPS: 500.0000 mg | ORAL_CAPSULE | Freq: Two times a day (BID) | ORAL | 0 refills | Status: DC

## 2022-01-21 NOTE — ED Provider Notes (Signed)
?[facility] ? ? ?MRN: [id number] DOB: [birth year] ? ?Subjective:  ? ?Lisa Watkins is a 21 y.o. female presenting for 2-3 day history of acute onset throat pain, painful swallowing, right ear pain, slight runny nose and slight cough. No chest pain, shob, wheezing, n/v, abdominal pain.  No smoker. No asthma. She does have some seasonal allergies.  ? ?No current facility-administered medications for this encounter. ? ?Current Outpatient Medications:  ?  drospirenone-ethinyl estradiol (YASMIN 28) 3-0.03 MG tablet, Take 1 tablet by mouth daily., Disp: 56 tablet, Rfl: 0  ? ?No Known Allergies ? ?Past Medical History:  ?Diagnosis Date  ? Acute mesenteric adenitis 7 2011  ? abd ct and ugi with sbft   ? IUGR (intrauterine growth restriction)   ? 39 weeks gest diabetes  ?  ? ?Past Surgical History:  ?Procedure Laterality Date  ? WISDOM TOOTH EXTRACTION    ? ? ?Family History  ?Problem Relation Age of Onset  ? Heart attack Father   ? Hyperlipidemia Father   ? Cancer Father   ?     Bladder  ? Hyperlipidemia Maternal Grandfather   ? Hypertension Maternal Grandfather   ? Cancer Paternal Grandmother   ?     skin  ? Peripheral vascular disease Paternal Grandfather   ? ? ?Social History  ? ?Tobacco Use  ? Smoking status: Never  ?  Passive exposure: Yes  ? Smokeless tobacco: Never  ?Vaping Use  ? Vaping Use: Never used  ?Substance Use Topics  ? Alcohol use: No  ? Drug use: No  ? ? ?ROS ? ? ?Objective:  ? ?Vitals: ?BP 125/79 (BP Location: Right Arm)   Pulse (!) 115   Temp 98.4 ?F (36.9 ?C) (Oral)   Resp 18   LMP 12/31/2021 (Approximate)   SpO2 95%  ? ?Physical Exam ?Constitutional:   ?   General: She is not in acute distress. ?   Appearance: Normal appearance. She is well-developed and normal weight. She is not ill-appearing, toxic-appearing or diaphoretic.  ?HENT:  ?   Head: Normocephalic and atraumatic.  ?   Right Ear: Tympanic membrane, ear canal and external ear normal. No drainage or tenderness. No middle  ear effusion. There is no impacted cerumen. Tympanic membrane is not erythematous.  ?   Left Ear: Tympanic membrane, ear canal and external ear normal. No drainage or tenderness.  No middle ear effusion. There is no impacted cerumen. Tympanic membrane is not erythematous.  ?   Nose: Nose normal. No congestion or rhinorrhea.  ?   Mouth/Throat:  ?   Mouth: Mucous membranes are moist. No oral lesions.  ?   Pharynx: Posterior oropharyngeal erythema (post-nasal drainage) present. No pharyngeal swelling, oropharyngeal exudate or uvula swelling.  ?   Tonsils: No tonsillar exudate or tonsillar abscesses.  ?Eyes:  ?   General: No scleral icterus.    ?   Right eye: No discharge.     ?   Left eye: No discharge.  ?   Extraocular Movements: Extraocular movements intact.  ?   Right eye: Normal extraocular motion.  ?   Left eye: Normal extraocular motion.  ?   Conjunctiva/sclera: Conjunctivae normal.  ?Cardiovascular:  ?   Rate and Rhythm: Normal rate.  ?   Heart sounds: No murmur heard. ?  No friction rub. No gallop.  ?Pulmonary:  ?   Effort: Pulmonary effort is normal. No respiratory distress.  ?   Breath sounds: No stridor. No wheezing, rhonchi or rales.  ?  Chest:  ?   Chest wall: No tenderness.  ?Musculoskeletal:  ?   Cervical back: Normal range of motion and neck supple.  ?Lymphadenopathy:  ?   Cervical: No cervical adenopathy.  ?Skin: ?   General: Skin is warm and dry.  ?Neurological:  ?   General: No focal deficit present.  ?   Mental Status: She is alert and oriented to person, place, and time.  ?Psychiatric:     ?   Mood and Affect: Mood normal.     ?   Behavior: Behavior normal.  ? ? ?Results for orders placed or performed during the hospital encounter of 01/21/22 (from the past 24 hour(s))  ?POCT rapid strep A     Status: Abnormal  ? Collection Time: 01/21/22 10:50 AM  ?Result Value Ref Range  ? Rapid Strep A Screen Positive (A) Negative  ? ? ?Assessment and Plan :  ? ?PDMP not reviewed this encounter. ? ?1. Viral upper  respiratory illness   ?2. Throat pain   ?3. Acute otalgia, right   ?4. Subacute cough   ?5. Runny nose   ?6. Strep throat exposure   ? ? Deferred imaging given clear cardiopulmonary exam, hemodynamically stable vital signs. Will treat for strep pharyngitis.  Patient is to start amoxicillin, use supportive care otherwise. Counseled patient on potential for adverse effects with medications prescribed/recommended today, ER and return-to-clinic precautions discussed, patient verbalized understanding. ? ?  ?Wallis Bamberg, PA-C ?01/21/22 1051 ? ?

## 2022-01-21 NOTE — ED Triage Notes (Signed)
Sore throat since Sunday with fever.  Exposed to strep. ?

## 2022-01-24 ENCOUNTER — Other Ambulatory Visit (HOSPITAL_COMMUNITY): Payer: Self-pay

## 2022-01-28 ENCOUNTER — Other Ambulatory Visit (HOSPITAL_COMMUNITY)
Admission: RE | Admit: 2022-01-28 | Discharge: 2022-01-28 | Disposition: A | Discharge status: Home / Self Care | Payer: 59 | Source: Ambulatory Visit | Attending: Nurse Practitioner | Admitting: Nurse Practitioner

## 2022-01-28 ENCOUNTER — Other Ambulatory Visit (HOSPITAL_COMMUNITY): Payer: Self-pay

## 2022-01-28 ENCOUNTER — Encounter: Payer: Self-pay | Admitting: Nurse Practitioner

## 2022-01-28 ENCOUNTER — Ambulatory Visit (INDEPENDENT_AMBULATORY_CARE_PROVIDER_SITE_OTHER): Payer: 59 | Admitting: Nurse Practitioner

## 2022-01-28 VITALS — BP 124/84 | Ht 65.5 in | Wt 218.0 lb

## 2022-01-28 DIAGNOSIS — Z23 Encounter for immunization: Secondary | ICD-10-CM

## 2022-01-28 DIAGNOSIS — Z3041 Encounter for surveillance of contraceptive pills: Secondary | ICD-10-CM

## 2022-01-28 DIAGNOSIS — Z01419 Encounter for gynecological examination (general) (routine) without abnormal findings: Secondary | ICD-10-CM | POA: Insufficient documentation

## 2022-01-28 DIAGNOSIS — E282 Polycystic ovarian syndrome: Secondary | ICD-10-CM | POA: Diagnosis not present

## 2022-01-28 MED ORDER — DROSPIRENONE-ETHINYL ESTRADIOL 3-0.03 MG PO TABS
1.0000 | ORAL_TABLET | Freq: Every day | ORAL | 3 refills | Status: DC
  Filled 2022-01-28 – 2022-03-23 (×2): qty 84, 84d supply, fill #0
  Filled 2022-06-11: qty 84, 84d supply, fill #1
  Filled 2022-08-14: qty 84, 84d supply, fill #2
  Filled 2022-11-07: qty 84, 84d supply, fill #3

## 2022-01-28 NOTE — Progress Notes (Signed)
? ?  Lisa Watkins 07-Jan-2001 [id number] ? ? ?History:  21 y.o. G0 presents for annual exam. PCOS. Increased COCs in January with better cycle regulation. [place]. Has not had Gardasil series and would like to start today.  ? ?Gynecologic History ?Patient's last menstrual period was 12/31/2021 (approximate). ?Period Cycle (Days): 28 ?Period Duration (Days): 7 ?Period Pattern: Regular ?Menstrual Flow: Moderate, Heavy ?Dysmenorrhea: (!) Moderate ?Dysmenorrhea Symptoms: Cramping ?Contraception/Family planning: abstinence and OCP (estrogen/progesterone) ? ?Health Maintenance ?Last Pap: Never ?Last mammogram: N/A ?Last colonoscopy: N/A ?Last Dexa: N/A ? ?Past medical history, past surgical history, family history and social history were all reviewed and documented in the EPIC chart. 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 Student at [employer], unsure of major.   ? ?ROS:  A ROS was performed and pertinent positives and negatives are included. ? ?Exam: ? ?Vitals:  ? 01/28/22 1025  ?BP: 124/84  ?Weight: 218 lb (98.9 kg)  ?Height: 5' 5.5" (1.664 m)  ? ?Body mass index is 35.73 kg/m?. ? ?General appearance:  Normal ?Thyroid:  Symmetrical, normal in size, without palpable masses or nodularity. ?Respiratory ? Auscultation:  Clear without wheezing or rhonchi ?Cardiovascular ? Auscultation:  Regular rate, without rubs, murmurs or gallops ? Edema/varicosities:  Not grossly evident ?Abdominal ? Soft,nontender, without masses, guarding or rebound. ? Liver/spleen:  No organomegaly noted ? Hernia:  None appreciated ? Skin ? Inspection:  Grossly normal ?Breasts: Not indicated per guidelines ?Gentitourinary  ? Inguinal/mons:  Normal without inguinal adenopathy ? External genitalia:  Normal appearing vulva with no masses, tenderness, or lesions ? BUS/Urethra/Skene's glands:  Normal ? Vagina:  Normal appearing with normal color and discharge, no lesions ? Cervix:  Normal appearing without discharge or lesions ? Uterus:  Normal in size, shape and contour.   Midline and mobile, nontender ? Adnexa/parametria:   ?  Rt: Normal in size, without masses or tenderness. ?  Lt: Normal in size, without masses or tenderness. ? Anus and perineum: Normal ? ?Assessment/Plan:  21 y.o. G0 for annual exam.  ? ?Well female exam with routine gynecological exam - Plan: Cytology - PAP( [place]). Education provided on SBEs, importance of preventative screenings, current guidelines, high calcium diet, safe sex, regular exercise, and multivitamin daily.  ? ?Encounter for surveillance of contraceptive pills - Plan: drospirenone-ethinyl estradiol (YASMIN 28) 3-0.03 MG tablet daily. Taking as prescribed.  ? ?PCOS (polycystic ovarian syndrome) - Plan: drospirenone-ethinyl estradiol (YASMIN 28) 3-0.03 MG tablet daily. Good cycle regulation. Refill x 1 year provided.  ? ?Need for HPV vaccination - Plan: HPV 9-valent vaccine,Recombinat. Will return in 2 months for 2nd dose.  ? ?Screening for cervical cancer - Initial pap today.  ? ?Return in 1 year for annual. ? ? ? ? ?Lisa Mackie DNP, 10:31 AM 01/28/2022 ? ?

## 2022-01-29 LAB — CYTOLOGY - PAP
Adequacy: ABSENT
Diagnosis: NEGATIVE

## 2022-02-03 ENCOUNTER — Ambulatory Visit: Payer: 59 | Admitting: Nurse Practitioner

## 2022-02-04 ENCOUNTER — Telehealth: Payer: 59 | Admitting: Nurse Practitioner

## 2022-02-04 DIAGNOSIS — N3000 Acute cystitis without hematuria: Secondary | ICD-10-CM

## 2022-02-04 MED ORDER — NITROFURANTOIN MONOHYD MACRO 100 MG PO CAPS: 100.0000 mg | ORAL_CAPSULE | Freq: Two times a day (BID) | ORAL | 0 refills | Status: AC

## 2022-02-04 NOTE — Progress Notes (Signed)
E-Visit for Urinary Problems ° °We are sorry that you are not feeling well.  Here is how we plan to help! ° °Based on what you shared with me it looks like you most likely have a simple urinary tract infection. ° °A UTI (Urinary Tract Infection) is a bacterial infection of the bladder. ° °Most cases of urinary tract infections are simple to treat but a key part of your care is to encourage you to drink plenty of fluids and watch your symptoms carefully. ° °I have prescribed MacroBid 100 mg twice a day for 5 days.  Your symptoms should gradually improve. Call us if the burning in your urine worsens, you develop worsening fever, back pain or pelvic pain or if your symptoms do not resolve after completing the antibiotic. ° °Urinary tract infections can be prevented by drinking plenty of water to keep your body hydrated.  Also be sure when you wipe, wipe from front to back and don't hold it in!  If possible, empty your bladder every 4 hours. ° °HOME CARE °Drink plenty of fluids °Compete the full course of the antibiotics even if the symptoms resolve °Remember, when you need to go…go. Holding in your urine can increase the likelihood of getting a UTI! °GET HELP RIGHT AWAY IF: °You cannot urinate °You get a high fever °Worsening back pain occurs °You see blood in your urine °You feel sick to your stomach or throw up °You feel like you are going to pass out ° °MAKE SURE YOU  °Understand these instructions. °Will watch your condition. °Will get help right away if you are not doing well or get worse. ° ° °Thank you for choosing an e-visit. ° °Your e-visit answers were reviewed by a board certified advanced clinical practitioner to complete your personal care plan. Depending upon the condition, your plan could have included both over the counter or prescription medications. ° °Please review your pharmacy choice. Make sure the pharmacy is open so you can pick up prescription now. If there is a problem, you may contact your  provider through MyChart messaging and have the prescription routed to another pharmacy.  Your safety is important to us. If you have drug allergies check your prescription carefully.  ° °For the next 24 hours you can use MyChart to ask questions about today's visit, request a non-urgent call back, or ask for a work or school excuse. °You will get an email in the next two days asking about your experience. I hope that your e-visit has been valuable and will speed your recovery.  ° °I spent approximately 7 minutes reviewing the patient's history, current symptoms and coordinating their plan of care today.   ° °Meds ordered this encounter  °Medications  ° nitrofurantoin, macrocrystal-monohydrate, (MACROBID) 100 MG capsule  °  Sig: Take 1 capsule (100 mg total) by mouth 2 (two) times daily for 5 days.  °  Dispense:  10 capsule  °  Refill:  0  °  °

## 2022-03-24 ENCOUNTER — Other Ambulatory Visit (HOSPITAL_COMMUNITY): Payer: Self-pay

## 2022-04-18 ENCOUNTER — Ambulatory Visit (INDEPENDENT_AMBULATORY_CARE_PROVIDER_SITE_OTHER): Payer: 59

## 2022-04-18 DIAGNOSIS — Z23 Encounter for immunization: Secondary | ICD-10-CM

## 2022-06-12 ENCOUNTER — Other Ambulatory Visit (HOSPITAL_COMMUNITY): Payer: Self-pay

## 2022-06-23 DIAGNOSIS — H52223 Regular astigmatism, bilateral: Secondary | ICD-10-CM | POA: Diagnosis not present

## 2022-08-15 ENCOUNTER — Other Ambulatory Visit (HOSPITAL_COMMUNITY): Payer: Self-pay

## 2022-08-25 ENCOUNTER — Other Ambulatory Visit (HOSPITAL_COMMUNITY): Payer: Self-pay

## 2022-11-11 ENCOUNTER — Other Ambulatory Visit: Payer: Self-pay

## 2023-02-02 ENCOUNTER — Ambulatory Visit: Payer: 59 | Admitting: Nurse Practitioner

## 2023-02-05 ENCOUNTER — Other Ambulatory Visit (HOSPITAL_COMMUNITY): Payer: Self-pay

## 2023-02-05 ENCOUNTER — Ambulatory Visit (INDEPENDENT_AMBULATORY_CARE_PROVIDER_SITE_OTHER): Payer: Commercial Managed Care - PPO | Admitting: Nurse Practitioner

## 2023-02-05 ENCOUNTER — Encounter: Payer: Self-pay | Admitting: Nurse Practitioner

## 2023-02-05 VITALS — BP 92/62 | HR 84 | Ht 65.5 in | Wt 221.0 lb

## 2023-02-05 DIAGNOSIS — Z0000 Encounter for general adult medical examination without abnormal findings: Secondary | ICD-10-CM

## 2023-02-05 DIAGNOSIS — Z23 Encounter for immunization: Secondary | ICD-10-CM

## 2023-02-05 DIAGNOSIS — E282 Polycystic ovarian syndrome: Secondary | ICD-10-CM | POA: Diagnosis not present

## 2023-02-05 DIAGNOSIS — Z01419 Encounter for gynecological examination (general) (routine) without abnormal findings: Secondary | ICD-10-CM

## 2023-02-05 DIAGNOSIS — Z3041 Encounter for surveillance of contraceptive pills: Secondary | ICD-10-CM

## 2023-02-05 MED ORDER — DROSPIRENONE-ETHINYL ESTRADIOL 3-0.03 MG PO TABS
1.0000 | ORAL_TABLET | Freq: Every day | ORAL | 3 refills | Status: DC
Start: 2023-02-05 — End: 2023-12-29
  Filled 2023-02-05: qty 84, 84d supply, fill #0
  Filled 2023-04-19: qty 84, 84d supply, fill #1
  Filled 2023-07-19: qty 84, 84d supply, fill #2
  Filled 2023-09-30: qty 84, 84d supply, fill #3

## 2023-02-05 NOTE — Addendum Note (Signed)
Addended by: Jodelle Red D on: 02/05/2023 11:35 AM   Modules accepted: Orders

## 2023-02-05 NOTE — Progress Notes (Signed)
   Lisa Watkins 11/10/2000 [id number]   History:  22 y.o. G0 presents for annual exam. PCOS. Good management on COCs. Normal pap history. Received 2 doses of Gardasil last year.  Gynecologic History Patient's last menstrual period was 02/04/2023 (exact date). Period Cycle (Days): 28 Period Duration (Days): 5-6 Menstrual Flow:  (varies) Menstrual Control: Tampon Dysmenorrhea: (!) Moderate Dysmenorrhea Symptoms: Cramping, Nausea, Headache Contraception/Family planning: abstinence and OCP (estrogen/progesterone) Sexually active: Never  Health Maintenance Last Pap: 01/28/2022. Results were: Normal Last mammogram: Not indicated Last colonoscopy: Not indicated Last Dexa: Not indicated  Past medical history, past surgical history, family history and social history were all reviewed and documented in the EPIC chart. Student at [employer], unsure of major.  Considering radiography and radiation therapy.   ROS:  A ROS was performed and pertinent positives and negatives are included.  Exam:  Vitals:   02/05/23 1057  BP: 92/62  Pulse: 84  SpO2: 97%  Weight: 221 lb (100.2 kg)  Height: 5' 5.5" (1.664 m)    Body mass index is 36.22 kg/m.  General appearance:  Normal Thyroid:  Symmetrical, normal in size, without palpable masses or nodularity. Respiratory  Auscultation:  Clear without wheezing or rhonchi Cardiovascular  Auscultation:  Regular rate, without rubs, murmurs or gallops  Edema/varicosities:  Not grossly evident Abdominal  Soft,nontender, without masses, guarding or rebound.  Liver/spleen:  No organomegaly noted  Hernia:  None appreciated  Skin  Inspection:  Grossly normal Breasts: Not indicated per guidelines Gentitourinary Not indicated  Assessment/Plan:  22 y.o. G0 for annual exam.   Well female exam with routine gynecological exam - Education provided on SBEs, importance of preventative screenings, current guidelines, high calcium diet, safe sex, regular exercise, and  multivitamin daily.   Encounter for surveillance of contraceptive pills - Plan: drospirenone-ethinyl estradiol (YASMIN 28) 3-0.03 MG tablet daily. Taking as prescribed.   PCOS (polycystic ovarian syndrome) - Plan: drospirenone-ethinyl estradiol (YASMIN 28) 3-0.03 MG tablet daily. Good cycle regulation. Refill x 1 year provided.   Need for HPV vaccination - Plan: HPV 9-valent vaccine,Recombinat. 3rd dose today.   Screening for cervical cancer - Normal pap history. Will repeat at 3-year interval per guidelines.   Return in 1 year for annual.     Lisa Mackie DNP, 11:14 AM 02/05/2023

## 2023-02-09 ENCOUNTER — Other Ambulatory Visit (HOSPITAL_COMMUNITY): Payer: Self-pay

## 2023-04-28 ENCOUNTER — Other Ambulatory Visit (HOSPITAL_COMMUNITY): Payer: Self-pay

## 2023-07-29 ENCOUNTER — Other Ambulatory Visit: Payer: Self-pay

## 2023-07-29 ENCOUNTER — Ambulatory Visit
Admission: EM | Admit: 2023-07-29 | Discharge: 2023-07-29 | Disposition: A | Discharge status: Home / Self Care | Payer: Commercial Managed Care - PPO | Attending: Nurse Practitioner | Admitting: Nurse Practitioner

## 2023-07-29 ENCOUNTER — Encounter: Payer: Self-pay | Admitting: Emergency Medicine

## 2023-07-29 DIAGNOSIS — J020 Streptococcal pharyngitis: Secondary | ICD-10-CM

## 2023-07-29 LAB — POCT RAPID STREP A (OFFICE): Rapid Strep A Screen: POSITIVE — AB

## 2023-07-29 MED ORDER — AMOXICILLIN 500 MG PO CAPS: 500.0000 mg | ORAL_CAPSULE | Freq: Two times a day (BID) | ORAL | 0 refills | Status: DC

## 2023-07-29 NOTE — ED Triage Notes (Signed)
Pt reports sore throat, bilateral ear fullness for last several days. Denies any known fevers or other symptoms.

## 2023-07-29 NOTE — Discharge Instructions (Signed)
Strep throat test today is positive.  Take the amoxicillin as prescribed to treat it.  Change toothbrush today or tomorrow to prevent reinfection.  You can continue the over-the-counter medications to help with symptoms.  You can also take Tylenol or Motrin as needed for pain.

## 2023-07-29 NOTE — ED Provider Notes (Signed)
[facility]    CSN: [id number] Arrival date & time: 07/29/23  1521      History   Chief Complaint Chief Complaint  Patient presents with   Sore Throat    HPI Lisa Watkins is a 22 y.o. female.   Patient presents today with 1 day history of runny and stuffy nose, sore throat, headache, and ear pain.  Also endorses decreased appetite because that hurts to swallow.  No fevers, body aches or chills, cough, shortness of breath or chest pain, abdominal pain, nausea/vomiting, or diarrhea.  No known sick contacts.  Has been taking Sudafed and Mucinex for symptoms without much improvement.    Past Medical History:  Diagnosis Date   Acute mesenteric adenitis 7 2011   abd ct and ugi with sbft    IUGR (intrauterine growth restriction)    39 weeks gest diabetes    Patient Active Problem List   Diagnosis Date Noted   Irregular periods/menstrual cycles 01/30/2020   Sports physical 11/10/2013   Well adolescent visit 04/18/2011   BMI (body mass index), pediatric, > 99% for age 27/03/2011   RHINITIS 11/07/2008    Past Surgical History:  Procedure Laterality Date   WISDOM TOOTH EXTRACTION      OB History     Gravida  0   Para  0   Term  0   Preterm  0   AB  0   Living  0      SAB  0   IAB  0   Ectopic  0   Multiple  0   Live Births  0            Home Medications    Prior to Admission medications   Medication Sig Start Date End Date Taking? Authorizing Provider  amoxicillin (AMOXIL) 500 MG capsule Take 1 capsule (500 mg total) by mouth 2 (two) times daily for 10 days. 07/29/23 08/08/23 Yes Valentino Nose, NP  drospirenone-ethinyl estradiol (YASMIN 28) 3-0.03 MG tablet Take 1 tablet by mouth daily. 02/05/23   [name], NP    Family History Family History  Problem Relation Age of Onset   Heart attack Father    Hyperlipidemia Father    Cancer Father        Bladder   Hyperlipidemia Maternal Grandfather    Hypertension  Maternal Grandfather    Cancer Paternal Grandmother        skin   Peripheral vascular disease Paternal Grandfather     Social History Social History   Tobacco Use   Smoking status: Never    Passive exposure: Yes   Smokeless tobacco: Never  Vaping Use   Vaping status: Never Used  Substance Use Topics   Alcohol use: No   Drug use: No     Allergies   Patient has no known allergies.   Review of Systems Review of Systems Per HPI  Physical Exam Triage Vital Signs ED Triage Vitals  Encounter Vitals Group     BP 07/29/23 1537 123/67     Systolic BP Percentile --      Diastolic BP Percentile --      Pulse Rate 07/29/23 1537 (!) 105     Resp 07/29/23 1537 20     Temp 07/29/23 1537 99.2 F (37.3 C)     Temp Source 07/29/23 1537 Oral     SpO2 07/29/23 1537 97 %     Weight --      Height --  Head Circumference --      Peak Flow --      Pain Score 07/29/23 1536 5     Pain Loc --      Pain Education --      Exclude from Growth Chart --    No data found.  Updated Vital Signs BP 123/67 (BP Location: Right Arm)   Pulse (!) 105   Temp 99.2 F (37.3 C) (Oral)   Resp 20   LMP 07/16/2023 (Approximate)   SpO2 97%   Visual Acuity Right Eye Distance:   Left Eye Distance:   Bilateral Distance:    Right Eye Near:   Left Eye Near:    Bilateral Near:     Physical Exam Vitals and nursing note reviewed.  Constitutional:      General: She is not in acute distress.    Appearance: She is well-developed. She is not toxic-appearing.  HENT:     Head: Normocephalic and atraumatic.     Right Ear: Tympanic membrane and ear canal normal. No drainage, swelling or tenderness. No middle ear effusion. Tympanic membrane is not erythematous.     Left Ear: Tympanic membrane and ear canal normal. No drainage, swelling or tenderness.  No middle ear effusion. Tympanic membrane is not erythematous.     Nose: No congestion or rhinorrhea.     Mouth/Throat:     Mouth: Mucous membranes  are moist.     Pharynx: Oropharynx is clear. Uvula midline. Posterior oropharyngeal erythema present. No oropharyngeal exudate.     Tonsils: No tonsillar exudate. 1+ on the right. 1+ on the left.  Eyes:     Extraocular Movements:     Right eye: Normal extraocular motion.     Left eye: Normal extraocular motion.  Cardiovascular:     Rate and Rhythm: Normal rate and regular rhythm.  Pulmonary:     Effort: Pulmonary effort is normal. No respiratory distress.     Breath sounds: Normal breath sounds. No wheezing, rhonchi or rales.  Musculoskeletal:     Cervical back: Normal range of motion and neck supple.  Lymphadenopathy:     Cervical: No cervical adenopathy.  Skin:    General: Skin is warm and dry.     Capillary Refill: Capillary refill takes less than 2 seconds.     Coloration: Skin is not pale.     Findings: No erythema or rash.  Neurological:     Mental Status: She is alert and oriented to person, place, and time.  Psychiatric:        Behavior: Behavior is cooperative.      UC Treatments / Results  Labs (all labs ordered are listed, but only abnormal results are displayed) Labs Reviewed  POCT RAPID STREP A (OFFICE) - Abnormal; Notable for the following components:      Result Value   Rapid Strep A Screen Positive (*)    All other components within normal limits    EKG   Radiology No results found.  Procedures Procedures (including critical care time)  Medications Ordered in UC Medications - No data to display  Initial Impression / Assessment and Plan / UC Course  I have reviewed the triage vital signs and the nursing notes.  Pertinent labs & imaging results that were available during my care of the patient were reviewed by me and considered in my medical decision making (see chart for details).   Patient is well-appearing, normotensive, afebrile, not tachypneic, oxygenating well on room air.  Patient is  mildly tachycardic in triage today.  1. Strep  pharyngitis Treat with amoxicillin twice daily for 10 days Supportive care discussed with patient ER and return precautions also discussed School excuse given  The patient was given the opportunity to ask questions.  All questions answered to their satisfaction.  The patient is in agreement to this plan.    Final Clinical Impressions(s) / UC Diagnoses   Final diagnoses:  Strep pharyngitis     Discharge Instructions      Strep throat test today is positive.  Take the amoxicillin as prescribed to treat it.  Change toothbrush today or tomorrow to prevent reinfection.  You can continue the over-the-counter medications to help with symptoms.  You can also take Tylenol or Motrin as needed for pain.     ED Prescriptions     Medication Sig Dispense Auth. Provider   amoxicillin (AMOXIL) 500 MG capsule Take 1 capsule (500 mg total) by mouth 2 (two) times daily for 10 days. 20 capsule Valentino Nose, NP      PDMP not reviewed this encounter.   Valentino Nose, NP 07/29/23 [phone]

## 2023-08-05 ENCOUNTER — Telehealth: Payer: Commercial Managed Care - PPO | Admitting: Physician Assistant

## 2023-08-05 DIAGNOSIS — J020 Streptococcal pharyngitis: Secondary | ICD-10-CM | POA: Diagnosis not present

## 2023-08-05 DIAGNOSIS — T7840XA Allergy, unspecified, initial encounter: Secondary | ICD-10-CM | POA: Diagnosis not present

## 2023-08-06 MED ORDER — AZITHROMYCIN 250 MG PO TABS
ORAL_TABLET | ORAL | 0 refills | Status: AC
Start: 2023-08-06 — End: 2023-08-11

## 2023-08-06 NOTE — Progress Notes (Signed)
E-Visit for Sore Throat - Strep Symptoms  We are sorry that you are not feeling well.  Here is how we plan to help!  Based on what you have shared with me it is likely that you are having an allergic reaction to the Amoxicillin given at Urgent care for your strep throat. Because we cannot have you continue this medication, but need to complete a full course of antibiotic to ensure no continued strep, we need to do a few things.  First, stop the Amoxicillin. I have added this to your allergies list. The reaction should calm down as the medicine is leaving your system but to be cautious, I recommend you use Benadryl 25 mg OTC every 6 hours over the next day to help calm this down. If anything continues to progress or you note any tongue swelling or shortness of breath, you need an in-person evaluation ASAP.   Next, I have prescribed Azithromycin 250 mg two tablets today and then one daily for 4 additional days. For throat pain, we recommend over the counter oral pain relief medications such as acetaminophen or aspirin, or anti-inflammatory medications such as ibuprofen or naproxen sodium. Topical treatments such as oral throat lozenges or sprays may be used as needed. Strep infections are not as easily transmitted as other respiratory infections, however we still recommend that you avoid close contact with loved ones, especially the very young and elderly.  Remember to wash your hands thoroughly throughout the day as this is the number one way to prevent the spread of infection and wipe down door knobs and counters with disinfectant.   Home Care: Only take medications as instructed by your medical team. Complete the entire course of an antibiotic. Do not take these medications with alcohol. A steam or ultrasonic humidifier can help congestion.  You can place a towel over your head and breathe in the steam from hot water coming from a faucet. Avoid close contacts especially the very young and the  elderly. Cover your mouth when you cough or sneeze. Always remember to wash your hands.  Get Help Right Away If: You develop worsening fever or sinus pain. You develop a severe head ache or visual changes. Your symptoms persist after you have completed your treatment plan.  Make sure you Understand these instructions. Will watch your condition. Will get help right away if you are not doing well or get worse.   Thank you for choosing an e-visit.  Your e-visit answers were reviewed by a board certified advanced clinical practitioner to complete your personal care plan. Depending upon the condition, your plan could have included both over the counter or prescription medications.  Please review your pharmacy choice. Make sure the pharmacy is open so you can pick up prescription now. If there is a problem, you may contact your provider through [employer] and have the prescription routed to another pharmacy.  Your safety is important to [place]. If you have drug allergies check your prescription carefully.   For the next 24 hours you can use MyChart to ask questions about today's visit, request a non-urgent call back, or ask for a work or school excuse. You will get an email in the next two days asking about your experience. I hope that your e-visit has been valuable and will speed your recovery.

## 2023-08-06 NOTE — Progress Notes (Signed)
I have spent 5 minutes in review of e-visit questionnaire, review and updating patient chart, medical decision making and response to patient.   Mia Milan Cody Jacklynn Dehaas, PA-C    

## 2023-12-23 ENCOUNTER — Telehealth: Admitting: Family Medicine

## 2023-12-23 DIAGNOSIS — N390 Urinary tract infection, site not specified: Secondary | ICD-10-CM

## 2023-12-23 MED ORDER — NITROFURANTOIN MONOHYD MACRO 100 MG PO CAPS: 100.0000 mg | ORAL_CAPSULE | Freq: Two times a day (BID) | ORAL | 0 refills | Status: AC

## 2023-12-23 NOTE — Progress Notes (Signed)

## 2023-12-29 ENCOUNTER — Other Ambulatory Visit: Payer: Self-pay

## 2023-12-29 ENCOUNTER — Other Ambulatory Visit: Payer: Self-pay | Admitting: Nurse Practitioner

## 2023-12-29 ENCOUNTER — Other Ambulatory Visit (HOSPITAL_COMMUNITY): Payer: Self-pay

## 2023-12-29 DIAGNOSIS — Z3041 Encounter for surveillance of contraceptive pills: Secondary | ICD-10-CM

## 2023-12-29 DIAGNOSIS — E282 Polycystic ovarian syndrome: Secondary | ICD-10-CM

## 2023-12-29 MED ORDER — DROSPIRENONE-ETHINYL ESTRADIOL 3-0.03 MG PO TABS
1.0000 | ORAL_TABLET | Freq: Every day | ORAL | 0 refills | Status: DC
Start: 2023-12-29 — End: 2024-02-08
  Filled 2023-12-29: qty 84, 84d supply, fill #0

## 2023-12-29 NOTE — Telephone Encounter (Signed)
 Med refill request: Last AEX: 02/05/2023-TW Next AEX: scheduled for 02/08/2024-TW Last MMG (if hormonal med): n/a Refill authorized: rx pend.

## 2024-02-08 ENCOUNTER — Ambulatory Visit (INDEPENDENT_AMBULATORY_CARE_PROVIDER_SITE_OTHER): Payer: Commercial Managed Care - PPO | Admitting: Nurse Practitioner

## 2024-02-08 ENCOUNTER — Encounter: Payer: Self-pay | Admitting: Nurse Practitioner

## 2024-02-08 ENCOUNTER — Other Ambulatory Visit (HOSPITAL_COMMUNITY): Payer: Self-pay

## 2024-02-08 VITALS — BP 118/82 | HR 103 | Ht 65.5 in | Wt 240.0 lb

## 2024-02-08 DIAGNOSIS — Z01419 Encounter for gynecological examination (general) (routine) without abnormal findings: Secondary | ICD-10-CM | POA: Diagnosis not present

## 2024-02-08 DIAGNOSIS — N921 Excessive and frequent menstruation with irregular cycle: Secondary | ICD-10-CM

## 2024-02-08 DIAGNOSIS — E282 Polycystic ovarian syndrome: Secondary | ICD-10-CM

## 2024-02-08 DIAGNOSIS — Z1331 Encounter for screening for depression: Secondary | ICD-10-CM

## 2024-02-08 DIAGNOSIS — Z3041 Encounter for surveillance of contraceptive pills: Secondary | ICD-10-CM | POA: Diagnosis not present

## 2024-02-08 MED ORDER — NORETHINDRONE ACET-ETHINYL EST 1.5-30 MG-MCG PO TABS
1.0000 | ORAL_TABLET | Freq: Every day | ORAL | 3 refills | Status: AC
  Filled 2024-02-08: qty 84, 84d supply, fill #0
  Filled 2024-05-10: qty 84, 84d supply, fill #1
  Filled 2024-07-29: qty 84, 84d supply, fill #2
  Filled 2024-10-21: qty 84, 84d supply, fill #3

## 2024-02-08 NOTE — Progress Notes (Addendum)
 Lisa Watkins Oct 16, 2000 [id number]   History:  23 y.o. G0 presents for annual exam. PCOS. Has done well on COCs but has been having breakthrough bleeding mid cycle for about 5 months. Bleeding is light to moderate and lasts a few days. Denies vaginal symptoms. Not sexually active. No missed doses. Normal pap history. Completed Gardasil in 2024. Grandmother diagnosed with ovarian cancer in late 52s. Patient's mother wanted her to ask about genetic testing.   Gynecologic History Patient's last menstrual period was 01/25/2024 (approximate). Period Cycle (Days): 28 Period Duration (Days): 5 Period Pattern: Regular Menstrual Flow: Moderate Menstrual Control:  (Disk) Dysmenorrhea: (!) Mild Dysmenorrhea Symptoms: Cramping Contraception/Family planning: abstinence and OCP (estrogen/progesterone ) Sexually active: Never  Health Maintenance Last Pap: 01/28/2022. Results were: Normal Last mammogram: Not indicated Last colonoscopy: Not indicated Last Dexa: Not indicated      02/08/2024   11:58 AM  Depression screen PHQ 2/9  Decreased Interest 0  Down, Depressed, Hopeless 0  PHQ - 2 Score 0      Past medical history, past surgical history, family history and social history were all reviewed and documented in the EPIC chart. 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   ROS:  A ROS was performed and pertinent positives and negatives are included.  Exam:  Vitals:   02/08/24 1153  BP: 118/82  Pulse: (!) 103  SpO2: 98%  Weight: 240 lb (108.9 kg)  Height: 5' 5.5" (1.664 m)     Body mass index is 39.33 kg/m.  General appearance:  Normal Thyroid :  Symmetrical, normal in size, without palpable masses or nodularity. Respiratory  Auscultation:  Clear without wheezing or rhonchi Cardiovascular  Auscultation:  Regular rate, without rubs, murmurs or gallops  Edema/varicosities:  Not grossly evident Abdominal  Soft,nontender, without masses, guarding or rebound.  Liver/spleen:  No  organomegaly noted  Hernia:  None appreciated  Skin  Inspection:  Grossly normal Breasts: Not indicated per guidelines Pelvic: External genitalia:  no lesions              Urethra:  normal appearing urethra with no masses, tenderness or lesions              Bartholins and Skenes: normal                 Vagina: normal appearing vagina with normal color and discharge, no lesions              Cervix: no lesions Bimanual Exam:  Uterus:  no masses or tenderness              Adnexa: no mass, fullness, tenderness              Rectovaginal: Deferred              Anus:  normal, no lesions  Doria Garden, NP student present as chaperone.   Assessment/Plan:  23 y.o. G0 for annual exam.   Well female exam with routine gynecological exam - Education provided on SBEs, importance of preventative screenings, current guidelines, high calcium diet, safe sex, regular exercise, and multivitamin daily. Recommend genetic testing for mother prior to patient getting tested.   PCOS (polycystic ovarian syndrome) - Plan: Norethindrone Acetate-Ethinyl Estradiol  (JUNEL 1.5/30) 1.5-30 MG-MCG tablet daily.   Encounter for surveillance of contraceptive pills - Plan: Norethindrone Acetate-Ethinyl Estradiol  (JUNEL 1.5/30) 1.5-30 MG-MCG tablet daily.   Breakthrough bleeding on birth control pills - Plan: Norethindrone Acetate-Ethinyl Estradiol  (JUNEL 1.5/30) 1.5-30 MG-MCG tablet daily. Denies vaginal symptoms.  Not sexually active. No missed doses. Will switch to different COC. Not interested in alternative birth control method.   Screening for cervical cancer - Normal pap history. Will repeat at 3-year interval per guidelines.   Return in about 1 year (around 02/07/2025) for Annual.      Andee Bamberger DNP, 12:21 PM 02/08/2024

## 2024-10-01 ENCOUNTER — Ambulatory Visit
Admission: EM | Admit: 2024-10-01 | Discharge: 2024-10-01 | Disposition: A | Discharge status: Home / Self Care | Attending: Student | Admitting: Student

## 2024-10-01 ENCOUNTER — Encounter: Payer: Self-pay | Admitting: Emergency Medicine

## 2024-10-01 DIAGNOSIS — J069 Acute upper respiratory infection, unspecified: Secondary | ICD-10-CM | POA: Diagnosis not present

## 2024-10-01 DIAGNOSIS — B309 Viral conjunctivitis, unspecified: Secondary | ICD-10-CM | POA: Diagnosis not present

## 2024-10-01 MED ORDER — NAPHAZOLINE-PHENIRAMINE 0.025-0.3 % OP SOLN: 1.0000 [drp] | Freq: Four times a day (QID) | OPHTHALMIC | 0 refills | Status: AC | PRN

## 2024-10-01 MED ORDER — LIDOCAINE VISCOUS HCL 2 % MT SOLN: 15.0000 mL | OROMUCOSAL | 0 refills | Status: AC | PRN

## 2024-10-01 NOTE — ED Triage Notes (Signed)
 Bilateral eye redness that she noticed upon waking this morning.  Sore throat and left ear pain since Monday.

## 2024-10-01 NOTE — ED Provider Notes (Signed)
 " [facility]    CSN: [id number] Arrival date & time: 10/01/24  9146      History   Chief Complaint No chief complaint on file.   HPI Lisa Watkins is a 23 y.o. female presenting with eye concern and viral symptoms.   Bilateral eye redness that she noticed upon waking this morning.  Sore throat and left ear pain since Monday 09/26/24 (6 days), which has been persistent. Cough since 09/29/24 (2 days). States L eye was gunky this morning, and bilateral eye redness, which has improved throughout the day. Denies vision changes, photophobia.  Does not wear glasses or contacts.  The patient denies a history of pulmonary disease      HPI  Past Medical History:  Diagnosis Date   Acute mesenteric adenitis 7 2011   abd ct and ugi with sbft    IUGR (intrauterine growth restriction)    39 weeks gest diabetes    Patient Active Problem List   Diagnosis Date Noted   Irregular periods/menstrual cycles 01/30/2020   Sports physical 11/10/2013   Well adolescent visit 04/18/2011   BMI (body mass index), pediatric, > 99% for age 36/03/2011   RHINITIS 11/07/2008    Past Surgical History:  Procedure Laterality Date   WISDOM TOOTH EXTRACTION      OB History     Gravida  0   Para  0   Term  0   Preterm  0   AB  0   Living  0      SAB  0   IAB  0   Ectopic  0   Multiple  0   Live Births  0            Home Medications    Prior to Admission medications  Medication Sig Start Date End Date Taking? Authorizing Provider  lidocaine  (XYLOCAINE ) 2 % solution Use as directed 15 mLs in the mouth or throat every 4 (four) hours as needed for mouth pain. Swish/gargle and spit 10/01/24  Yes Zyonna Vardaman E, PA-C  naphazoline-pheniramine (NAPHCON-A) 0.025-0.3 % ophthalmic solution Place 1 drop into both eyes every 6 (six) hours as needed for eye irritation. 10/01/24  Yes Taneasha Fuqua E, PA-C  Norethindrone  Acetate-Ethinyl Estradiol  (JUNEL  1.5/30) 1.5-30 MG-MCG  tablet Take 1 tablet by mouth daily. 02/08/24   Prentiss Annabella LABOR, NP    Family History Family History  Problem Relation Age of Onset   Heart attack Father    Hyperlipidemia Father    Cancer Father        Bladder   Ovarian cancer Maternal Grandmother    Hyperlipidemia Maternal Grandfather    Hypertension Maternal Grandfather    Cancer Paternal Grandmother        skin   Peripheral vascular disease Paternal Grandfather     Social History Social History[1]   Allergies   Amoxicillin    Review of Systems Review of Systems  Constitutional:  Negative for appetite change, chills and fever.  HENT:  Positive for ear pain and sore throat. Negative for congestion, rhinorrhea, sinus pressure and sinus pain.   Eyes:  Positive for redness. Negative for visual disturbance.  Respiratory:  Negative for cough, chest tightness, shortness of breath and wheezing.   Cardiovascular:  Negative for chest pain and palpitations.  Gastrointestinal:  Negative for abdominal pain, constipation, diarrhea, nausea and vomiting.  Genitourinary:  Negative for dysuria, frequency and urgency.  Musculoskeletal:  Negative for myalgias.  Neurological:  Negative for dizziness, weakness  and headaches.  Psychiatric/Behavioral:  Negative for confusion.   All other systems reviewed and are negative.    Physical Exam Triage Vital Signs ED Triage Vitals  Encounter Vitals Group     BP 10/01/24 0935 (!) 143/85     Girls Systolic BP Percentile --      Girls Diastolic BP Percentile --      Boys Systolic BP Percentile --      Boys Diastolic BP Percentile --      Pulse Rate 10/01/24 0935 98     Resp 10/01/24 0935 18     Temp 10/01/24 0935 98.2 F (36.8 C)     Temp Source 10/01/24 0935 Oral     SpO2 10/01/24 0935 96 %     Weight --      Height --      Head Circumference --      Peak Flow --      Pain Score 10/01/24 0936 6     Pain Loc --      Pain Education --      Exclude from Growth Chart --    No data  found.  Updated Vital Signs BP (!) 143/85 (BP Location: Right Arm)   Pulse 98   Temp 98.2 F (36.8 C) (Oral)   Resp 18   LMP 09/27/2024 (Exact Date)   SpO2 96%   Visual Acuity Right Eye Distance: 20/20 Left Eye Distance: 20/20 Bilateral Distance: 20/20  Right Eye Near:   Left Eye Near:    Bilateral Near:     Physical Exam Vitals reviewed.  Constitutional:      General: She is not in acute distress.    Appearance: Normal appearance. She is not ill-appearing.  HENT:     Head: Normocephalic and atraumatic.     Right Ear: Tympanic membrane, ear canal and external ear normal. No tenderness. No middle ear effusion. There is no impacted cerumen. Tympanic membrane is not perforated, erythematous, retracted or bulging.     Left Ear: Tympanic membrane, ear canal and external ear normal. No tenderness.  No middle ear effusion. There is no impacted cerumen. Tympanic membrane is not perforated, erythematous, retracted or bulging.     Nose: Nose normal. No congestion.     Mouth/Throat:     Mouth: Mucous membranes are moist.     Pharynx: Uvula midline. No oropharyngeal exudate or posterior oropharyngeal erythema.     Tonsils: No tonsillar exudate.  Eyes:     General: Lids are normal. Lids are everted, no foreign bodies appreciated. Vision grossly intact. Gaze aligned appropriately.        Right eye: No foreign body, discharge or hordeolum.        Left eye: No foreign body, discharge or hordeolum.     Extraocular Movements: Extraocular movements intact.     Conjunctiva/sclera:     Right eye: Right conjunctiva is injected. No chemosis, exudate or hemorrhage.    Left eye: Left conjunctiva is injected. No chemosis, exudate or hemorrhage.    Pupils: Pupils are equal, round, and reactive to light.     Comments: Trace conjunctival injection bilaterally.  There are no lid changes.  PERRLA, EOMI.  Visual acuity intact.  Cardiovascular:     Rate and Rhythm: Normal rate and regular rhythm.      Heart sounds: Normal heart sounds.  Pulmonary:     Effort: Pulmonary effort is normal.     Breath sounds: Normal breath sounds. No decreased breath sounds, wheezing, rhonchi  or rales.  Abdominal:     Palpations: Abdomen is soft.     Tenderness: There is no abdominal tenderness. There is no guarding or rebound.  Lymphadenopathy:     Cervical: No cervical adenopathy.     Right cervical: No superficial, deep or posterior cervical adenopathy.    Left cervical: No superficial, deep or posterior cervical adenopathy.  Skin:    Comments: No rash   Neurological:     General: No focal deficit present.     Mental Status: She is alert and oriented to person, place, and time.  Psychiatric:        Mood and Affect: Mood normal.        Behavior: Behavior normal.        Thought Content: Thought content normal.        Judgment: Judgment normal.      UC Treatments / Results  Labs (all labs ordered are listed, but only abnormal results are displayed) Labs Reviewed - No data to display  EKG   Radiology No results found.  Procedures Procedures (including critical care time)  Medications Ordered in UC Medications - No data to display  Initial Impression / Assessment and Plan / UC Course  I have reviewed the triage vital signs and the nursing notes.  Pertinent labs & imaging results that were available during my care of the patient were reviewed by me and considered in my medical decision making (see chart for details).     Patient is a pleasant 23 y.o. female presenting with viral URI. The patient is afebrile and nontachycardic.  Antipyretic has not been administered today.  Visual acuity intact.  Does not wear glasses or contacts. Did not check a COVID or influenza test due to duration of symptoms. Centor score 0, did not check a strep test today.  We are managing viral conjunctivitis with OTC lubricating drops as below.  Also sent viscous lidocaine  for symptomatic relief.  Return  precautions as below.  Final Clinical Impressions(s) / UC Diagnoses   Final diagnoses:  Viral URI  Viral conjunctivitis     Discharge Instructions      -You have a virus, like the common cold.  Viruses typically last 5 to 7 days.  After 7 days, your symptoms should be improving rather than worsening.  If your symptoms improve, and then worsen again, this is when we worry about a sinus infection or a lung infection, and you should return for additional care. - Viruses can cause eye irritation. (See later in this packet for information on viral conjunctivitis.) - If your eye symptoms change, like thick discharge during the day, redness during the day, vision changes, sensitivity to light-you would need to be seen again. -I sent some eyedrops for you to use, that will help with irritation and redness.  In the morning, wash your face with gentle soap and water, and then apply the eyedrops.  You can use these as needed throughout the rest of the day. -For sore throat, use lidocaine  mouthwash up to every 4 hours. Make sure not to eat for at least 1 hour after using this, as your mouth will be very numb and you could bite yourself.     ED Prescriptions     Medication Sig Dispense Auth. Provider   lidocaine  (XYLOCAINE ) 2 % solution Use as directed 15 mLs in the mouth or throat every 4 (four) hours as needed for mouth pain. Swish/gargle and spit 100 mL Myron Lona E, PA-C  naphazoline-pheniramine (NAPHCON-A) 0.025-0.3 % ophthalmic solution Place 1 drop into both eyes every 6 (six) hours as needed for eye irritation. 15 mL Tiffanye Hartmann E, PA-C      PDMP not reviewed this encounter.     [1]  Social History Tobacco Use   Smoking status: Never    Passive exposure: Yes   Smokeless tobacco: Never  Vaping Use   Vaping status: Never Used  Substance Use Topics   Alcohol use: No   Drug use: No     Arlyss Leita BRAVO, PA-C 10/01/24 1055  "

## 2024-10-01 NOTE — Discharge Instructions (Addendum)
-  You have a virus, like the common cold.  Viruses typically last 5 to 7 days.  After 7 days, your symptoms should be improving rather than worsening.  If your symptoms improve, and then worsen again, this is when we worry about a sinus infection or a lung infection, and you should return for additional care. - Viruses can cause eye irritation. (See later in this packet for information on viral conjunctivitis.) - If your eye symptoms change, like thick discharge during the day, redness during the day, vision changes, sensitivity to light-you would need to be seen again. -I sent some eyedrops for you to use, that will help with irritation and redness.  In the morning, wash your face with gentle soap and water, and then apply the eyedrops.  You can use these as needed throughout the rest of the day. -For sore throat, use lidocaine  mouthwash up to every 4 hours. Make sure not to eat for at least 1 hour after using this, as your mouth will be very numb and you could bite yourself.

## 2024-10-02 ENCOUNTER — Telehealth: Admitting: Physician Assistant

## 2024-10-02 DIAGNOSIS — H1033 Unspecified acute conjunctivitis, bilateral: Secondary | ICD-10-CM

## 2024-10-02 NOTE — Progress Notes (Signed)
" °  Because of persistent/worsening symptoms despite visit and treatment at Urgent Care yesterday, I feel your condition warrants further evaluation and I recommend that you be seen in a face-to-face visit. I would call your PCP office this morning as they offer a Saturday clinic to see patients with acute needs on the weekend.   NOTE: There will be NO CHARGE for this E-Visit   If you are having a true medical emergency, please call 911.     For an urgent face to face visit, [facility] has multiple urgent care centers for your convenience.  Click the link below for the full list of locations and hours, walk-in wait times, appointment scheduling options and driving directions:  Urgent Care - [place], [place], [place], [place], [place], [place]  [place]     Your MyChart E-visit questionnaire answers were reviewed by a board certified advanced clinical practitioner to complete your personal care plan based on your specific symptoms.    Thank you for using e-Visits.    "
